# Patient Record
Sex: Female | Born: 1950
Health system: Southern US, Community
[De-identification: ages and names within clinical notes are randomized; demographics above are authoritative.]

---

## 2021-08-01 ENCOUNTER — Ambulatory Visit (INDEPENDENT_AMBULATORY_CARE_PROVIDER_SITE_OTHER): Payer: Medicare HMO

## 2021-08-01 ENCOUNTER — Encounter: Payer: Self-pay | Admitting: Podiatry

## 2021-08-01 ENCOUNTER — Ambulatory Visit: Payer: Medicare HMO | Admitting: Podiatry

## 2021-08-01 ENCOUNTER — Other Ambulatory Visit: Payer: Self-pay

## 2021-08-01 DIAGNOSIS — S9032XA Contusion of left foot, initial encounter: Secondary | ICD-10-CM

## 2021-08-01 DIAGNOSIS — B351 Tinea unguium: Secondary | ICD-10-CM

## 2021-08-01 DIAGNOSIS — M79674 Pain in right toe(s): Secondary | ICD-10-CM | POA: Diagnosis not present

## 2021-08-01 DIAGNOSIS — M79672 Pain in left foot: Secondary | ICD-10-CM | POA: Diagnosis not present

## 2021-08-01 DIAGNOSIS — M79675 Pain in left toe(s): Secondary | ICD-10-CM

## 2021-08-01 DIAGNOSIS — I890 Lymphedema, not elsewhere classified: Secondary | ICD-10-CM

## 2021-08-01 NOTE — Progress Notes (Signed)
  Subjective:  Patient ID: Chelsey Robinson, female    DOB: [birth year],   MRN: [id number]  No chief complaint on file.   70 y.o. female presents for pain in left foot as well as thickened elongated toenails that are difficult for her to cut. Relates she has a history of lymphedema and developed a wound on her left leg for which wound care is following. States she is on blood thinners and has been afraid to tirm her nails. 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  . 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 Denies any other pedal complaints. Denies n/v/f/c.   History reviewed. No pertinent past medical history.  Objective:  Physical Exam: Vascular: DP/PT pulses 2/4 bilateral. CFT <3 seconds. Normal hair growth on digits. Left leg +2 pitting edema noted.  Skin. No lacerations or abrasions bilateral feet. Xerosis noted to bilateral lower extremities. Nails 1-5 are thickened discolored and elongated with subungual debris.  Musculoskeletal: MMT 5/5 bilateral lower extremities in DF, PF, Inversion and Eversion. Deceased ROM in DF of ankle joint. Tenderness cicrumferentially around left foot.  Neurological: Sensation intact to light touch.   Assessment:   1. Onychomycosis   2. Contusion of left foot, initial encounter   3. Pain due to onychomycosis of toenails of both feet      Plan:  Patient was evaluated and treated and all questions answered. X-rays reviewed and discussed with patient. No acute fractures or dislocations noted. Non-weightbearing films with possible cavus foot appearance.  -Discussed and educated patient on foot care, especially with  regards to the vascular, neurological and musculoskeletal systems.  -Discussed supportive shoes at all times and checking feet regularly. Will continue with brace but discussed once lymphedema and wound are treated may consider new brace as this one is 91+ years old.  -Mechanically debrided all nails 1-5 bilateral  using sterile nail nipper and filed with dremel without incident  -Answered all patient questions -Patient to return  in 3 months for at risk foot care -Patient advised to call the office if any problems or questions arise in the meantime.   Louann Sjogren, DPM

## 2021-11-07 ENCOUNTER — Ambulatory Visit (INDEPENDENT_AMBULATORY_CARE_PROVIDER_SITE_OTHER): Payer: Self-pay | Admitting: Podiatry

## 2021-11-07 DIAGNOSIS — Z91199 Patient's noncompliance with other medical treatment and regimen due to unspecified reason: Secondary | ICD-10-CM

## 2021-11-07 NOTE — Progress Notes (Signed)
No show

## 2023-02-24 IMAGING — DX DG FOOT COMPLETE 3+V*L*
4 series · 4 of 4 positions shown · non-contrast
Comparison: None.

CLINICAL DATA: Left foot injury 40 years ago post surgery.
Difficulty bearing weight.

EXAM:
LEFT FOOT - COMPLETE 3+ VIEW

[foot ap (1 of 2)]
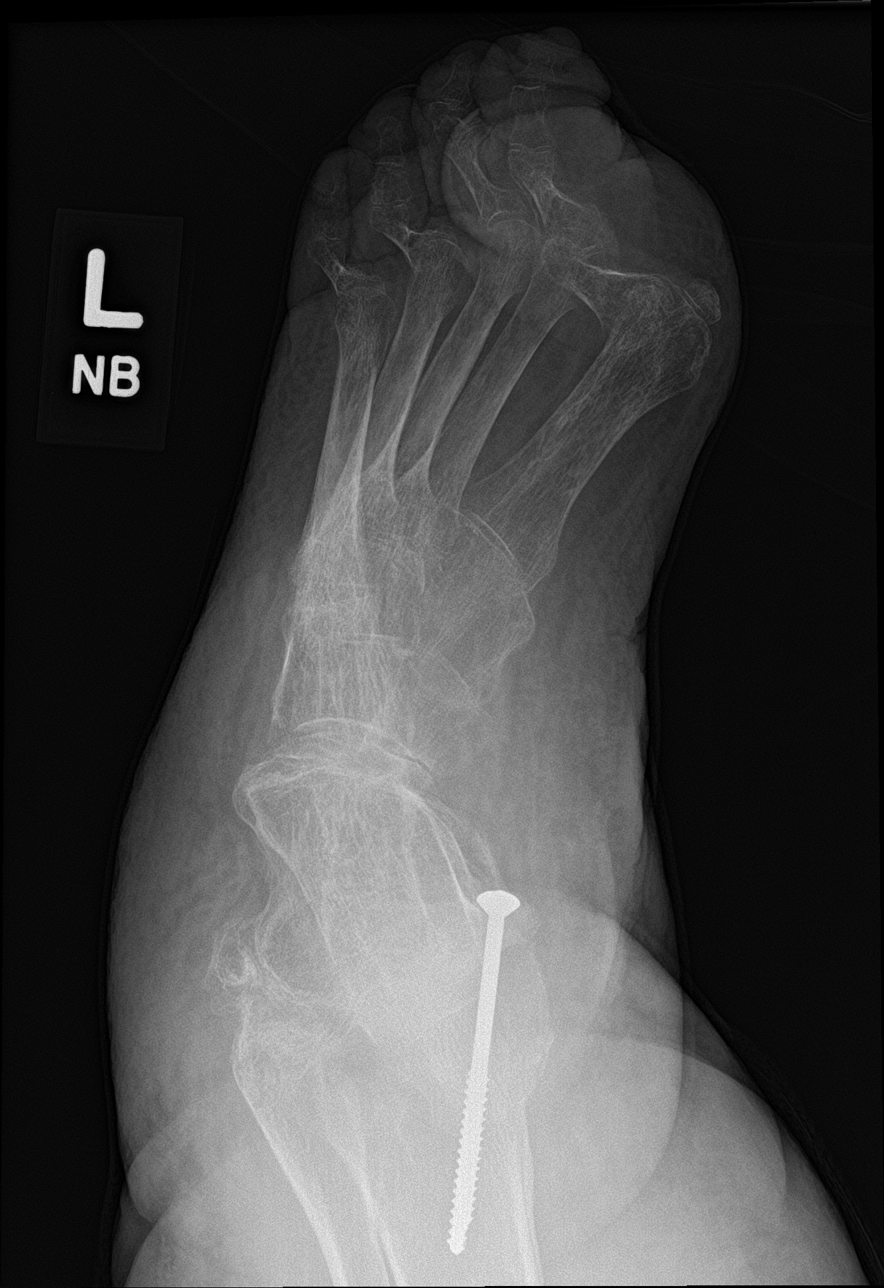

[foot lat]
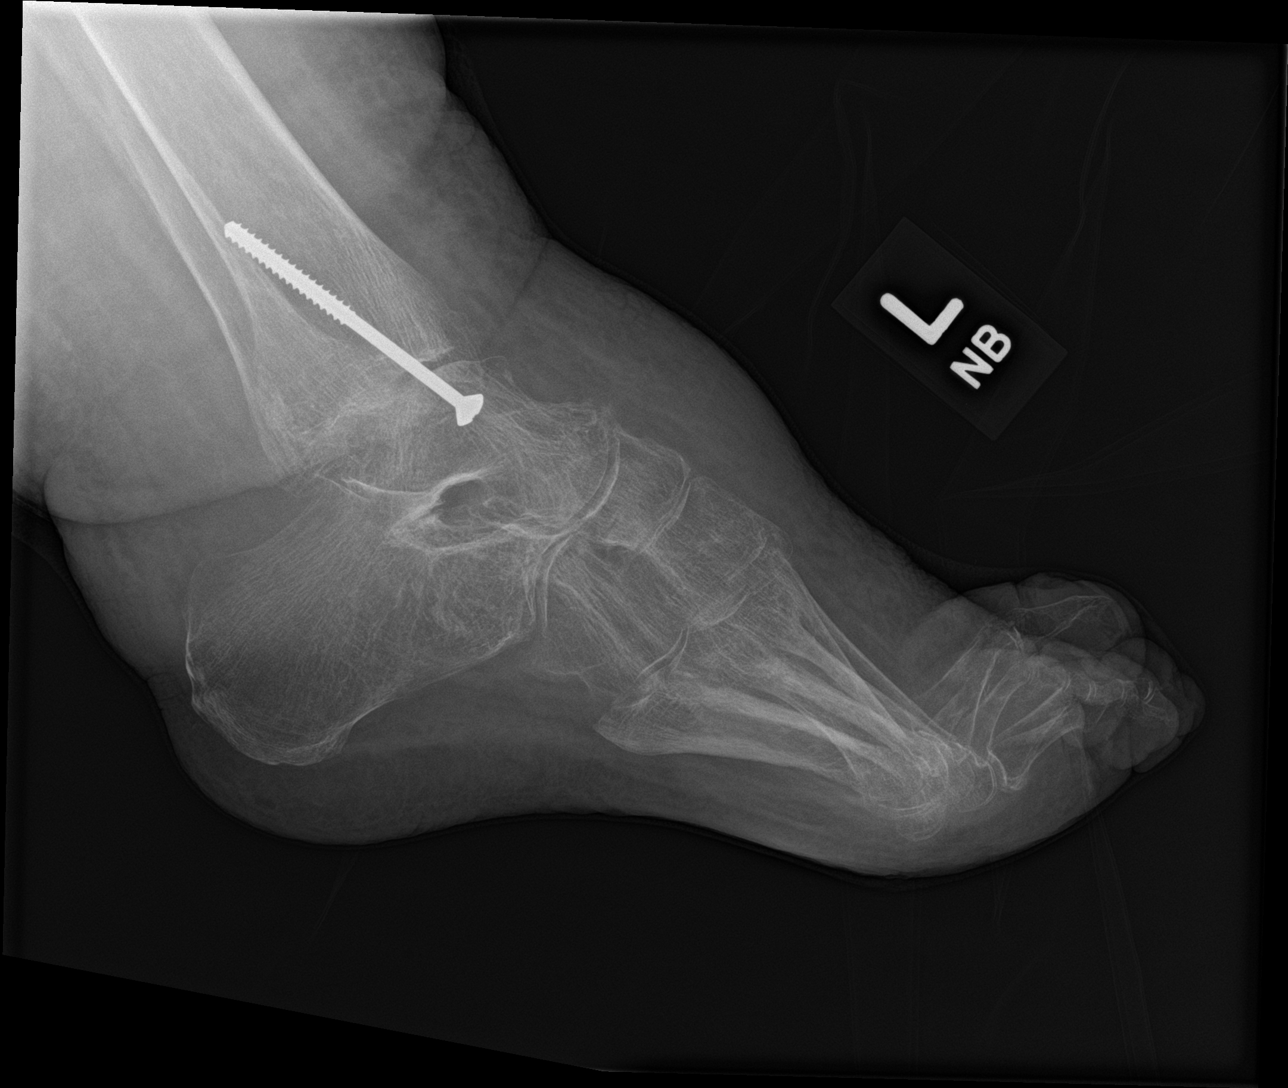

[foot obl]
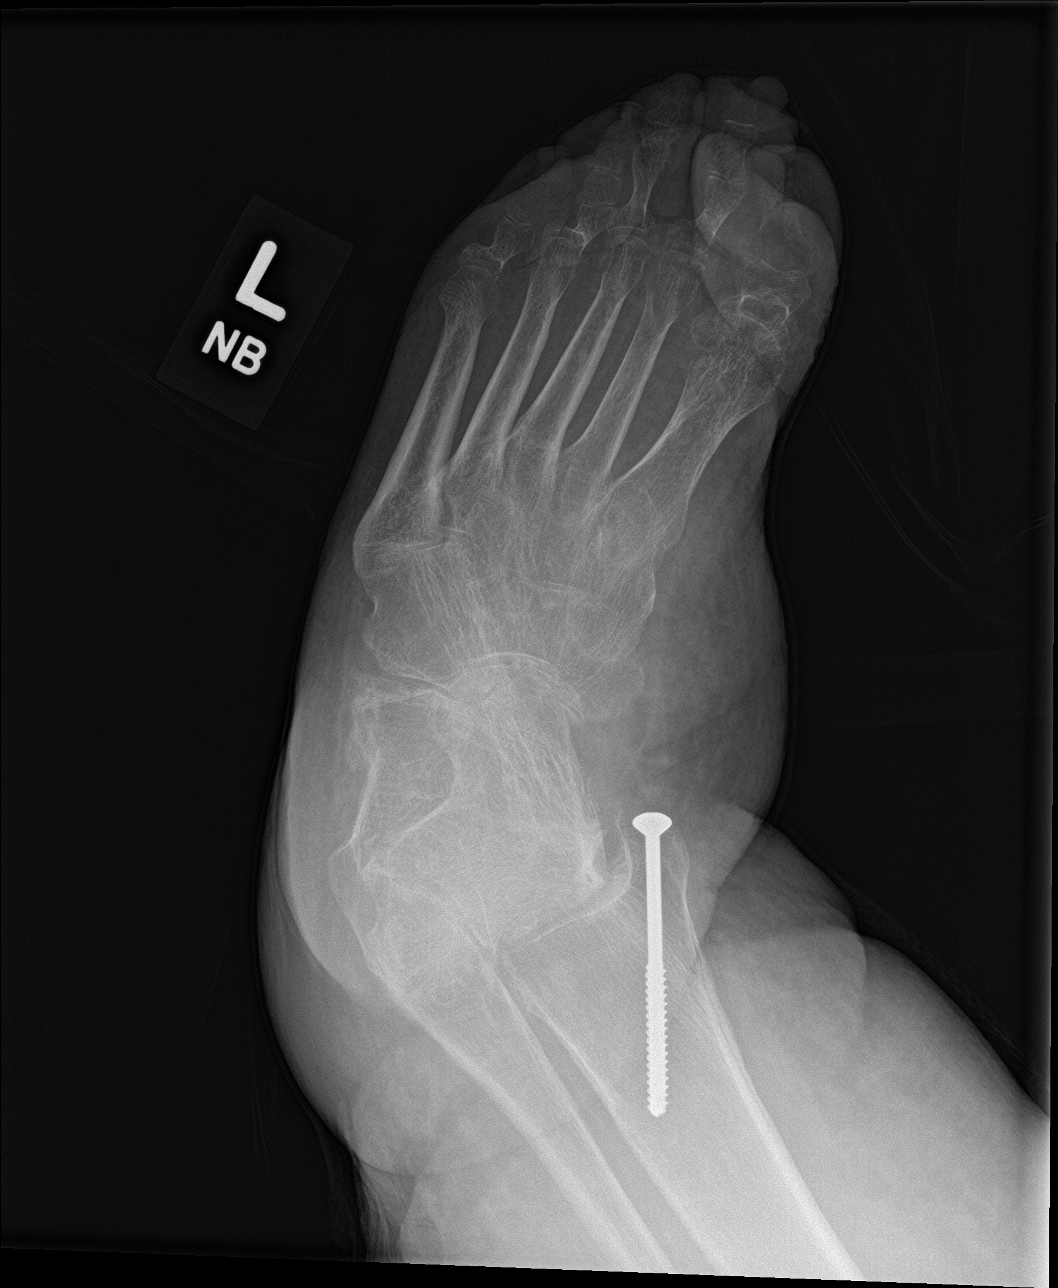

[foot ap (2 of 2)]
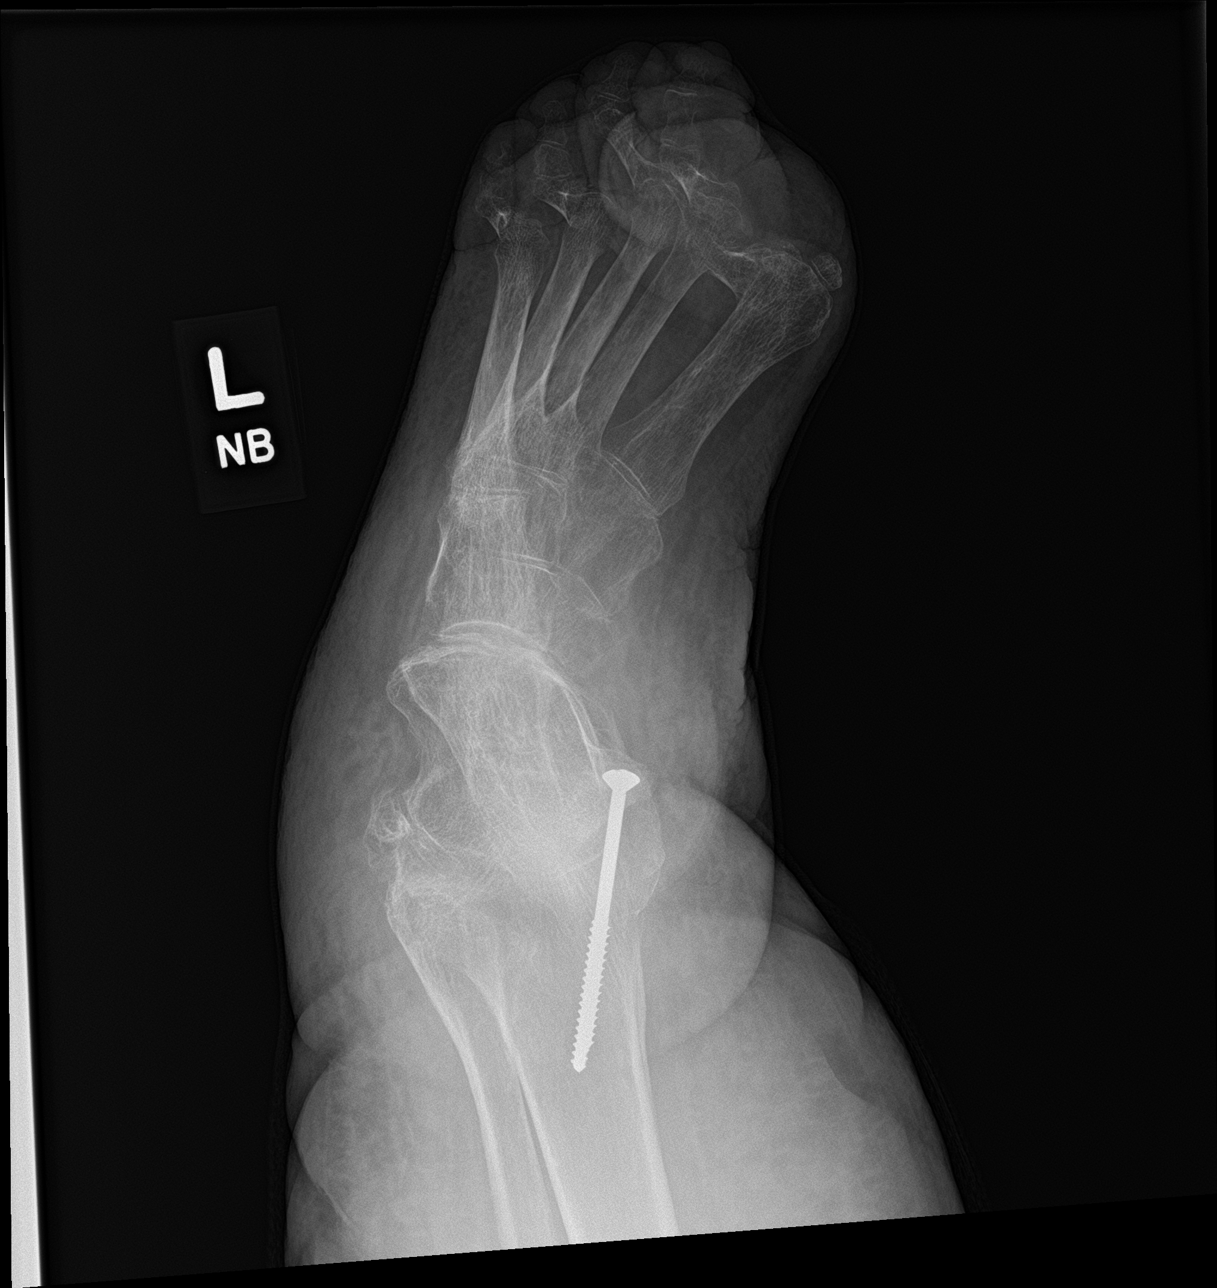

[4 of 4 positions shown; findings below may reference images not displayed]

FINDINGS: Exam demonstrates moderate diffuse decreased bone mineralization.
Single surgical screw intact extending inferior to superior from the
talus into the tibia. No definite acute fracture or dislocation.
Mild degenerative changes of the midfoot/hindfoot region. Diffuse
soft tissue swelling over the foot and ankle.
IMPRESSION: 1. No acute findings.
2. Moderate diffuse decreased bone mineralization. Mild degenerative
changes over the midfoot/hindfoot region. Surgical screw intact
extending inferior to superior to the talus.
